# Patient Record
Sex: Female | Born: 1970 | Race: White | Hispanic: No | Marital: Married | Smoking: Current every day smoker
Health system: Southern US, Community
[De-identification: ages and names within clinical notes are randomized; demographics above are authoritative.]

## PROBLEM LIST (undated history)

## (undated) DIAGNOSIS — F329 Major depressive disorder, single episode, unspecified: Secondary | ICD-10-CM

## (undated) DIAGNOSIS — F32A Depression, unspecified: Secondary | ICD-10-CM

## (undated) DIAGNOSIS — B977 Papillomavirus as the cause of diseases classified elsewhere: Secondary | ICD-10-CM

## (undated) DIAGNOSIS — Z8659 Personal history of other mental and behavioral disorders: Secondary | ICD-10-CM

## (undated) DIAGNOSIS — F17200 Nicotine dependence, unspecified, uncomplicated: Secondary | ICD-10-CM

## (undated) DIAGNOSIS — B009 Herpesviral infection, unspecified: Secondary | ICD-10-CM

## (undated) DIAGNOSIS — F419 Anxiety disorder, unspecified: Secondary | ICD-10-CM

## (undated) HISTORY — DX: Anxiety disorder, unspecified: F41.9

## (undated) HISTORY — PX: CHOLECYSTECTOMY, LAPAROSCOPIC: SHX56

## (undated) HISTORY — PX: WISDOM TOOTH EXTRACTION: SHX21

## (undated) HISTORY — DX: Depression, unspecified: F32.A

## (undated) HISTORY — PX: TONSILLECTOMY: SHX5217

## (undated) HISTORY — DX: Nicotine dependence, unspecified, uncomplicated: F17.200

## (undated) HISTORY — DX: Personal history of other mental and behavioral disorders: Z86.59

## (undated) HISTORY — DX: Major depressive disorder, single episode, unspecified: F32.9

## (undated) HISTORY — DX: Papillomavirus as the cause of diseases classified elsewhere: B97.7

## (undated) HISTORY — PX: TEMPOROMANDIBULAR JOINT SURGERY: SHX35

## (undated) HISTORY — PX: GYNECOLOGIC CRYOSURGERY: SHX857

## (undated) HISTORY — DX: Herpesviral infection, unspecified: B00.9

---

## 1995-07-24 HISTORY — PX: ROTATOR CUFF REPAIR: SHX139

## 1999-03-04 ENCOUNTER — Ambulatory Visit (HOSPITAL_COMMUNITY): Admission: RE | Admit: 1999-03-04 | Discharge: 1999-03-04 | Payer: Self-pay | Admitting: Oral Surgery

## 1999-03-30 ENCOUNTER — Ambulatory Visit (HOSPITAL_COMMUNITY): Admission: RE | Admit: 1999-03-30 | Discharge: 1999-03-30 | Payer: Self-pay | Admitting: Oral Surgery

## 1999-10-13 ENCOUNTER — Other Ambulatory Visit: Admission: RE | Admit: 1999-10-13 | Discharge: 1999-10-13 | Payer: Self-pay | Admitting: Gynecology

## 1999-11-07 ENCOUNTER — Encounter (INDEPENDENT_AMBULATORY_CARE_PROVIDER_SITE_OTHER): Payer: Self-pay | Admitting: Specialist

## 1999-11-07 ENCOUNTER — Other Ambulatory Visit: Admission: RE | Admit: 1999-11-07 | Discharge: 1999-11-07 | Payer: Self-pay | Admitting: Gynecology

## 2000-05-16 ENCOUNTER — Other Ambulatory Visit: Admission: RE | Admit: 2000-05-16 | Discharge: 2000-05-16 | Payer: Self-pay | Admitting: Obstetrics and Gynecology

## 2001-03-25 ENCOUNTER — Ambulatory Visit (HOSPITAL_COMMUNITY): Admission: RE | Admit: 2001-03-25 | Discharge: 2001-03-25 | Payer: Self-pay | Admitting: *Deleted

## 2002-05-01 ENCOUNTER — Other Ambulatory Visit: Admission: RE | Admit: 2002-05-01 | Discharge: 2002-05-01 | Payer: Self-pay | Admitting: Gynecology

## 2003-07-13 ENCOUNTER — Other Ambulatory Visit: Admission: RE | Admit: 2003-07-13 | Discharge: 2003-07-13 | Payer: Self-pay | Admitting: Gynecology

## 2004-09-08 ENCOUNTER — Other Ambulatory Visit: Admission: RE | Admit: 2004-09-08 | Discharge: 2004-09-08 | Payer: Self-pay | Admitting: Gynecology

## 2006-03-06 ENCOUNTER — Other Ambulatory Visit: Admission: RE | Admit: 2006-03-06 | Discharge: 2006-03-06 | Payer: Self-pay | Admitting: Gynecology

## 2007-03-20 ENCOUNTER — Other Ambulatory Visit: Admission: RE | Admit: 2007-03-20 | Discharge: 2007-03-20 | Payer: Self-pay | Admitting: Gynecology

## 2007-03-24 DIAGNOSIS — B977 Papillomavirus as the cause of diseases classified elsewhere: Secondary | ICD-10-CM

## 2007-03-24 HISTORY — DX: Papillomavirus as the cause of diseases classified elsewhere: B97.7

## 2007-08-15 ENCOUNTER — Other Ambulatory Visit: Admission: RE | Admit: 2007-08-15 | Discharge: 2007-08-15 | Payer: Self-pay | Admitting: Gynecology

## 2008-05-13 ENCOUNTER — Other Ambulatory Visit: Admission: RE | Admit: 2008-05-13 | Discharge: 2008-05-13 | Payer: Self-pay | Admitting: Gynecology

## 2008-05-13 ENCOUNTER — Encounter: Payer: Self-pay | Admitting: Gynecology

## 2008-05-13 ENCOUNTER — Ambulatory Visit: Payer: Self-pay | Admitting: Gynecology

## 2008-05-26 ENCOUNTER — Ambulatory Visit: Payer: Self-pay | Admitting: Gynecology

## 2009-10-14 ENCOUNTER — Other Ambulatory Visit: Admission: RE | Admit: 2009-10-14 | Discharge: 2009-10-14 | Payer: Self-pay | Admitting: Gynecology

## 2009-10-14 ENCOUNTER — Ambulatory Visit: Payer: Self-pay | Admitting: Gynecology

## 2010-04-12 ENCOUNTER — Encounter: Admission: RE | Admit: 2010-04-12 | Discharge: 2010-04-12 | Payer: Self-pay | Admitting: Sports Medicine

## 2010-12-08 NOTE — Cardiovascular Report (Signed)
[place]. [facility]  Patient:    Debbie Estrada, Debbie Estrada Visit Number: [id number] MRN: [id number]          Service Type: END Location: ENDO Attending Physician:  Darlin Priestly Proc. Date: 03/25/01 Admit Date:  03/25/2001   CC:         Runell Gess, M.D.   Cardiac Catheterization  PROCEDURE:  Transesophageal echocardiogram.  COMPLICATIONS:  None.  INDICATIONS:  [name] is a 40 year old white female patient of Runell Gess, M.D. with a history of recurrent presyncope.  She had a two-dimensional echocardiogram revealing normal LV function with mild atrial septal bowing and signal dropout in the midatrial septum.  AST could not be ruled out.  She now referred for transesophageal echocardiogram to evaluate her atrial septum.  DESCRIPTION OF PROCEDURE:  The patient was brought to the endoscopy suite in a fasted state.  She was given 100 mg of Demerol and 4 mg of Versed.  After adequate anesthesia, the patient underwent successful uncomplicated transesophageal echocardiogram.  This revealed normal LV size with normal function.  Estimated EF of 60%.  There was mild septal hypokinesis.  There was mild thickening in the anterior mitral valve leaflet with trivial to mild mitral regurgitation.  The aortic valve appeared to be structurally normal. The tricuspid valve appears to be structurally normal with trivial tricuspid regurgitation.  There is normal pulmonic valve.  There is no evidence of intracardiac mass or thrombus noted.  The patient was noted to have mild bowing of the atrial septum into the right atrium.  There is a prominent fossa ovalis, but no evidence of ASD or patent foramen ovale.  There was a negative contrast bubble study.  There was a normal descending thoracic aorta.  CONCLUSION:  Successful and uncomplicated transesophageal echocardiogram with findings noted above. Attending Physician:  Darlin Priestly DD:  03/25/01 TD:   03/25/01 Job: [id number]

## 2011-09-28 ENCOUNTER — Encounter: Payer: Self-pay | Admitting: Gynecology

## 2011-09-28 ENCOUNTER — Ambulatory Visit (INDEPENDENT_AMBULATORY_CARE_PROVIDER_SITE_OTHER): Payer: BC Managed Care – PPO | Admitting: Gynecology

## 2011-09-28 VITALS — BP 128/86 | Ht 67.0 in | Wt 132.0 lb

## 2011-09-28 DIAGNOSIS — Z833 Family history of diabetes mellitus: Secondary | ICD-10-CM

## 2011-09-28 DIAGNOSIS — N920 Excessive and frequent menstruation with regular cycle: Secondary | ICD-10-CM | POA: Insufficient documentation

## 2011-09-28 DIAGNOSIS — Z01419 Encounter for gynecological examination (general) (routine) without abnormal findings: Secondary | ICD-10-CM

## 2011-09-28 DIAGNOSIS — A609 Anogenital herpesviral infection, unspecified: Secondary | ICD-10-CM | POA: Insufficient documentation

## 2011-09-28 DIAGNOSIS — N879 Dysplasia of cervix uteri, unspecified: Secondary | ICD-10-CM | POA: Insufficient documentation

## 2011-09-28 DIAGNOSIS — F17200 Nicotine dependence, unspecified, uncomplicated: Secondary | ICD-10-CM | POA: Insufficient documentation

## 2011-09-28 DIAGNOSIS — R635 Abnormal weight gain: Secondary | ICD-10-CM

## 2011-09-28 DIAGNOSIS — Z30432 Encounter for removal of intrauterine contraceptive device: Secondary | ICD-10-CM

## 2011-09-28 DIAGNOSIS — B009 Herpesviral infection, unspecified: Secondary | ICD-10-CM

## 2011-09-28 DIAGNOSIS — F411 Generalized anxiety disorder: Secondary | ICD-10-CM

## 2011-09-28 DIAGNOSIS — N946 Dysmenorrhea, unspecified: Secondary | ICD-10-CM | POA: Insufficient documentation

## 2011-09-28 DIAGNOSIS — D72829 Elevated white blood cell count, unspecified: Secondary | ICD-10-CM

## 2011-09-28 DIAGNOSIS — F419 Anxiety disorder, unspecified: Secondary | ICD-10-CM | POA: Insufficient documentation

## 2011-09-28 LAB — CBC WITH DIFFERENTIAL/PLATELET
Basophils Relative: 0 % (ref 0–1)
Eosinophils Absolute: 0 10*3/uL (ref 0.0–0.7)
Eosinophils Relative: 0 % (ref 0–5)
Lymphs Abs: 1.4 10*3/uL (ref 0.7–4.0)
MCH: 35.3 pg — ABNORMAL HIGH (ref 26.0–34.0)
MCHC: 33.7 g/dL (ref 30.0–36.0)
MCV: 104.7 fL — ABNORMAL HIGH (ref 78.0–100.0)
Neutrophils Relative %: 78 % — ABNORMAL HIGH (ref 43–77)
Platelets: 199 10*3/uL (ref 150–400)
RDW: 13.4 % (ref 11.5–15.5)

## 2011-09-28 LAB — LIPID PANEL
Cholesterol: 184 mg/dL (ref 0–200)
Total CHOL/HDL Ratio: 3.1 Ratio

## 2011-09-28 LAB — TSH: TSH: 1.45 u[IU]/mL (ref 0.350–4.500)

## 2011-09-28 LAB — GLUCOSE, RANDOM: Glucose, Bld: 95 mg/dL (ref 70–99)

## 2011-09-28 MED ORDER — VALACYCLOVIR HCL 500 MG PO TABS: ORAL_TABLET | ORAL | Status: DC

## 2011-09-28 MED ORDER — ALPRAZOLAM 0.5 MG PO TABS: 0.5000 mg | ORAL_TABLET | Freq: Every evening | ORAL | Status: DC | PRN

## 2011-09-28 NOTE — Progress Notes (Signed)
Debbie Estrada November 15, 1970 [id number]   History:    41 y.o.  for annual exam who Corrie Dandy a year ago. Patient has Mirena IUD that was placed in November of 2009 and wanted to have it removed since her partner has had a vasectomy. 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 She also suffers from anxiety for which she takes in a 0.5 mg daily as needed. Patient smokes half a pack cigarette per day. Mother diagnosed with breast cancer at the age of 30. Patient does her monthly self breast examination. 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 Patient last seen in the office in 2011. Patient complaining of weight gain.  Past medical history,surgical history, family history and social history were all reviewed and documented in the EPIC chart.  Gynecologic History Patient's last menstrual period was 09/12/2011. Contraception: vasectomy Last Pap: 2011. Results were: normal Last mammogram: No prior study. Results were: No prior study  Obstetric History OB History    Grav Para Term Preterm Abortions TAB SAB Ect Mult Living   0                ROS:  Was performed and pertinent positives and negatives are included in the history.  Exam: chaperone present  BP 128/86  Ht 5\' 7"  (1.702 m)  Wt 132 lb (59.875 kg)  BMI 20.67 kg/m2  LMP 09/12/2011  Body mass index is 20.67 kg/(m^2).  General appearance : Well developed well nourished female. No acute distress HEENT: Neck supple, trachea midline, no carotid bruits, no thyroidmegaly Lungs: Clear to auscultation, no rhonchi or wheezes, or rib retractions  Heart: Regular rate and rhythm, no murmurs or gallops Breast:Examined in sitting and supine position were symmetrical in appearance, no palpable masses or tenderness,  no skin retraction, no nipple inversion, no nipple discharge, no skin discoloration, no axillary or supraclavicular  lymphadenopathy Abdomen: no palpable masses or tenderness, no rebound or guarding Extremities: no edema or skin discoloration or tenderness  Pelvic:  Bartholin, Urethra, Skene Glands: Within normal limits             Vagina: No gross lesions or discharge  Cervix: No gross lesions or discharge  Uterus  anteverted, normal size, shape and consistency, non-tender and mobile  Adnexa  Without masses or tenderness  Anus and perineum  normal   Rectovaginal  normal sphincter tone without palpated masses or tenderness             Hemoccult not done   Cervix was cleansed with Betadine solution the IUD string was grasped with a [place] clamp and the IUD was removed shown to the patient and discarded.  Assessment/Plan:  41 y.o. female for annual exam . We discussed the detrimental effects of smoking. Patient will [facility] She was instructed take calcium and vitamin D for osteoporosis prevention. Prescription for Valtrex 500 mg to take daily for HSV prophylaxis was provided. Prescription for Xanax 0.5 mg to take 1 by mouth daily when necessary anxiety. Requisition provided for patient to get a baseline mammogram since her mother was diagnosed breast cancer at age 43. A prescription for Lysteda 650 mg to take 2 tablets 3 times a day for 5 days during her menstrual cycle. The following labs will be drawn today: CBC, fasting lipid profile, TSH, fasting blood sugar, and urinalysis. New Pap smear screening guidelines discussed. No Pap smear  done today. Next Pap smear due next year.    Ok Edwards MD, 9:08 AM 09/28/2011

## 2011-09-28 NOTE — Patient Instructions (Addendum)

## 2011-09-29 LAB — URINALYSIS W MICROSCOPIC + REFLEX CULTURE
Bacteria, UA: NONE SEEN
Hgb urine dipstick: NEGATIVE
Ketones, ur: NEGATIVE mg/dL
Leukocytes, UA: NEGATIVE
Nitrite: NEGATIVE
Specific Gravity, Urine: 1.009 (ref 1.005–1.030)
Urobilinogen, UA: 0.2 mg/dL (ref 0.0–1.0)
pH: 7 (ref 5.0–8.0)

## 2011-10-01 ENCOUNTER — Telehealth: Payer: Self-pay | Admitting: *Deleted

## 2011-10-01 MED ORDER — TRANEXAMIC ACID 650 MG PO TABS: 1300.0000 mg | ORAL_TABLET | Freq: Three times a day (TID) | ORAL | Status: DC

## 2011-10-01 NOTE — Telephone Encounter (Signed)
Pt called stating pharmacy never got rx for Lysteda 650 mg to take 2 tablets 3 times a day for 5 days during her menstrual cycle. rx will be sent to pharmacy.

## 2011-10-02 NOTE — Progress Notes (Signed)
Addended by: Venora Maples on: 10/02/2011 12:25 PM   Modules accepted: Orders

## 2012-09-30 ENCOUNTER — Other Ambulatory Visit: Payer: Self-pay | Admitting: Gynecology

## 2013-05-05 ENCOUNTER — Telehealth: Payer: Self-pay | Admitting: *Deleted

## 2013-05-05 ENCOUNTER — Encounter: Payer: Self-pay | Admitting: Women's Health

## 2013-05-05 ENCOUNTER — Ambulatory Visit (INDEPENDENT_AMBULATORY_CARE_PROVIDER_SITE_OTHER): Payer: BC Managed Care – PPO | Admitting: Women's Health

## 2013-05-05 ENCOUNTER — Other Ambulatory Visit (HOSPITAL_COMMUNITY)
Admission: RE | Admit: 2013-05-05 | Discharge: 2013-05-05 | Disposition: A | Discharge status: Home / Self Care | Payer: BC Managed Care – PPO | Source: Ambulatory Visit | Attending: Gynecology | Admitting: Gynecology

## 2013-05-05 VITALS — BP 110/68 | Ht 65.5 in | Wt 110.0 lb

## 2013-05-05 DIAGNOSIS — G518 Other disorders of facial nerve: Secondary | ICD-10-CM

## 2013-05-05 DIAGNOSIS — G500 Trigeminal neuralgia: Secondary | ICD-10-CM

## 2013-05-05 DIAGNOSIS — B009 Herpesviral infection, unspecified: Secondary | ICD-10-CM

## 2013-05-05 DIAGNOSIS — N63 Unspecified lump in unspecified breast: Secondary | ICD-10-CM

## 2013-05-05 DIAGNOSIS — Z803 Family history of malignant neoplasm of breast: Secondary | ICD-10-CM

## 2013-05-05 DIAGNOSIS — Z01419 Encounter for gynecological examination (general) (routine) without abnormal findings: Secondary | ICD-10-CM | POA: Insufficient documentation

## 2013-05-05 DIAGNOSIS — F411 Generalized anxiety disorder: Secondary | ICD-10-CM

## 2013-05-05 DIAGNOSIS — F419 Anxiety disorder, unspecified: Secondary | ICD-10-CM

## 2013-05-05 MED ORDER — VALACYCLOVIR HCL 500 MG PO TABS: ORAL_TABLET | ORAL | Status: DC

## 2013-05-05 MED ORDER — ALPRAZOLAM 0.5 MG PO TABS: 0.5000 mg | ORAL_TABLET | Freq: Every evening | ORAL | Status: DC | PRN

## 2013-05-05 NOTE — Telephone Encounter (Signed)
Order placed for the breast center

## 2013-05-05 NOTE — Telephone Encounter (Signed)
Message copied by Aura Camps on Tue May 05, 2013  2:30 PM ------      Message from: [place], [place] J      Created: Tue May 05, 2013  1:03 PM       Please schedule pt diagnostic mammogram. 1 cm mobile nontender nodule left breast upper outer quadrant. Noted on annual exam.  Mother breast cancer history in her 52s doing well. Palpation of work/disability for facial pain anytime okay. ------

## 2013-05-05 NOTE — Patient Instructions (Signed)

## 2013-05-05 NOTE — Progress Notes (Signed)
Debbie Estrada 04/15/1971 [id number]    History:    The patient presents for annual exam.  Monthly cycles/heavy flow first 3 days/vasectomy. CIN-1 cryo- 2001 normal Paps after. HSV  rare outbreaks. Smokes half pack per day. Having severe facial pain has been to several neurologists with unknown cause. Currently on disability due to pain. Had lost 35 pounds and at one point was down to 95 pounds currently at 110, 132 last year at annual exam. History of anorexia and bulimia denies any of those issues at this time. Effexor prescribed through pain clinic and rare Xanax use, uses less than one prescription per year. Mother breast cancer in her 71s who is doing well.  Past medical history, past surgical history, family history and social history were all reviewed and documented in the EPIC chart. Father hypertension.   ROS:  A  ROS was performed and pertinent positives and negatives are included in the history.  Exam:  Filed Vitals:   05/05/13 1031  BP: 110/68    General appearance: thin Head/Neck:  Normal, without cervical or supraclavicular adenopathy. Thyroid:  Symmetrical, normal in size, without palpable masses or nodularity. Respiratory  Effort:  Normal  Auscultation:  Clear without wheezing or rhonchi Cardiovascular  Auscultation:  Regular rate, without rubs, murmurs or gallops  Edema/varicosities:  Not grossly evident Abdominal  Soft,nontender, without masses, guarding or rebound.  Liver/spleen:  No organomegaly noted  Hernia:  None appreciated  Skin  Inspection:  Grossly normal  Palpation:  Grossly normal Neurologic/psychiatric  Orientation:  Normal with appropriate conversation.  Mood/affect:  Normal  Genitourinary    Breasts: Examined lying and sitting.     Right: Without masses, retractions, discharge or axillary adenopathy.     Left: Without masses, retractions, discharge or axillary adenopathy.   Inguinal/mons:  Normal without inguinal adenopathy  External  genitalia:  Normal  BUS/Urethra/Skene's glands:  Normal  Bladder:  Normal  Vagina:  Normal  Cervix:  Normal  Uterus:   normal in size, shape and contour.  Midline and mobile  Adnexa/parametria:     Rt: Without masses or tenderness.   Lt: Without masses or tenderness.  Anus and perineum: Normal  Digital rectal exam: Normal sphincter tone without palpated masses or tenderness  Assessment/Plan:  42 y.o. MWF G0  for annual exam.    Severe facial pain Menorrhagia/vasectomy Smoker 2001 CIN-1 cryo- normal after Anxiety/depression/pain- Effexor HSV  Plan: Continue followup with neurologist. SBE's, annual mammogram, will schedule diagnostic. 1 cm mobile nontender nodule left upper outer quadrant. Aware of hazards of smoking, reviewed patches and other means to quit smoking. Xanax 0.5 at bedtime when necessary aware of addictive properties and use sparingly. Valtrex 500 twice daily 3-5 days as needed. UA, Pap. Options for menorrhagia reviewed, declines at this time, her option information given and reviewed, will call if decides to pursue. Discussed importance of increasing calories for weight gain and health. Denies need for counseling at this time.  Harrington Challenger [facility], 12:49 PM 05/05/2013

## 2013-05-07 ENCOUNTER — Other Ambulatory Visit: Payer: Self-pay | Admitting: Women's Health

## 2013-05-07 DIAGNOSIS — N63 Unspecified lump in unspecified breast: Secondary | ICD-10-CM

## 2013-05-07 DIAGNOSIS — Z803 Family history of malignant neoplasm of breast: Secondary | ICD-10-CM

## 2013-05-08 NOTE — Telephone Encounter (Signed)
appt 05/18/13 2 2:30 pm

## 2013-05-18 ENCOUNTER — Inpatient Hospital Stay: Admission: RE | Admit: 2013-05-18 | Payer: BC Managed Care – PPO | Source: Ambulatory Visit

## 2013-05-18 ENCOUNTER — Other Ambulatory Visit: Payer: BC Managed Care – PPO

## 2013-05-21 ENCOUNTER — Ambulatory Visit
Admission: RE | Admit: 2013-05-21 | Discharge: 2013-05-21 | Disposition: A | Discharge status: Home / Self Care | Payer: BC Managed Care – PPO | Source: Ambulatory Visit | Attending: Women's Health | Admitting: Women's Health

## 2013-05-21 ENCOUNTER — Other Ambulatory Visit: Payer: Self-pay | Admitting: Women's Health

## 2013-05-21 DIAGNOSIS — Z803 Family history of malignant neoplasm of breast: Secondary | ICD-10-CM

## 2013-05-21 DIAGNOSIS — N63 Unspecified lump in unspecified breast: Secondary | ICD-10-CM

## 2013-09-25 ENCOUNTER — Other Ambulatory Visit: Payer: Self-pay | Admitting: Women's Health

## 2013-09-25 NOTE — Telephone Encounter (Signed)
Rx called in [facility] 

## 2013-12-10 ENCOUNTER — Other Ambulatory Visit: Payer: Self-pay | Admitting: Women's Health

## 2013-12-10 NOTE — Telephone Encounter (Signed)
Called into pharmacy

## 2014-02-07 ENCOUNTER — Other Ambulatory Visit: Payer: Self-pay | Admitting: Women's Health

## 2014-02-08 NOTE — Telephone Encounter (Signed)
Ok to refill 

## 2014-02-08 NOTE — Telephone Encounter (Signed)
Called into pharmacy

## 2014-03-14 ENCOUNTER — Other Ambulatory Visit: Payer: Self-pay | Admitting: Women's Health

## 2014-03-15 NOTE — Telephone Encounter (Signed)
rx called in

## 2014-04-21 IMAGING — MG MM DIGITAL DIAGNOSTIC BILAT
7 series · 7 of 7 positions shown · non-contrast
Comparison: This is the patient's baseline exam.

CLINICAL DATA: Questioned palpable finding per patient left breast
1 o'clock location

EXAM:
DIGITAL DIAGNOSTIC BILATERAL MAMMOGRAM WITH CAD AND BILATERAL BREAST
ULTRASOUND

[R MLO (1 of 2)]
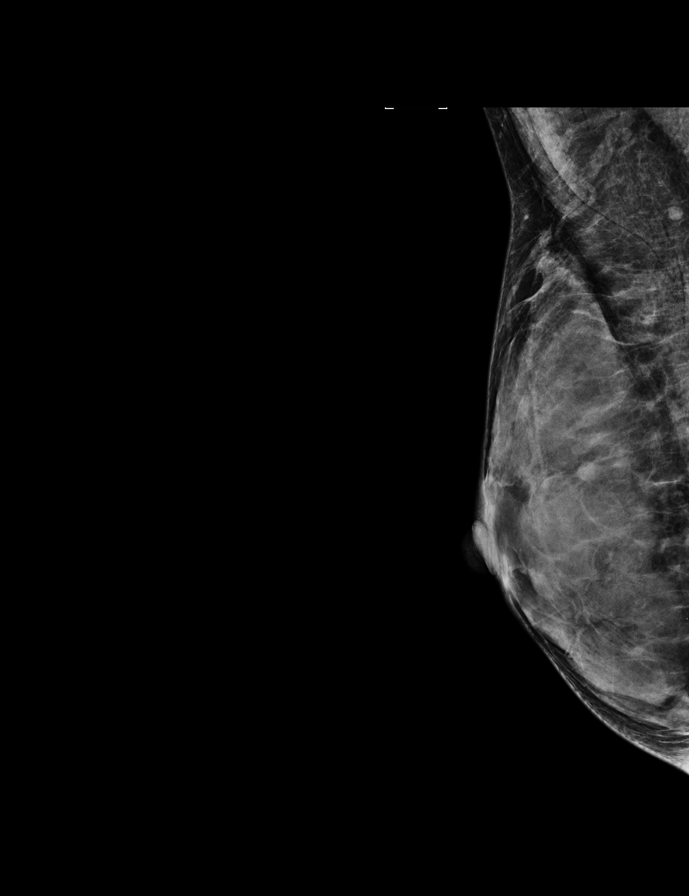

[L CC]
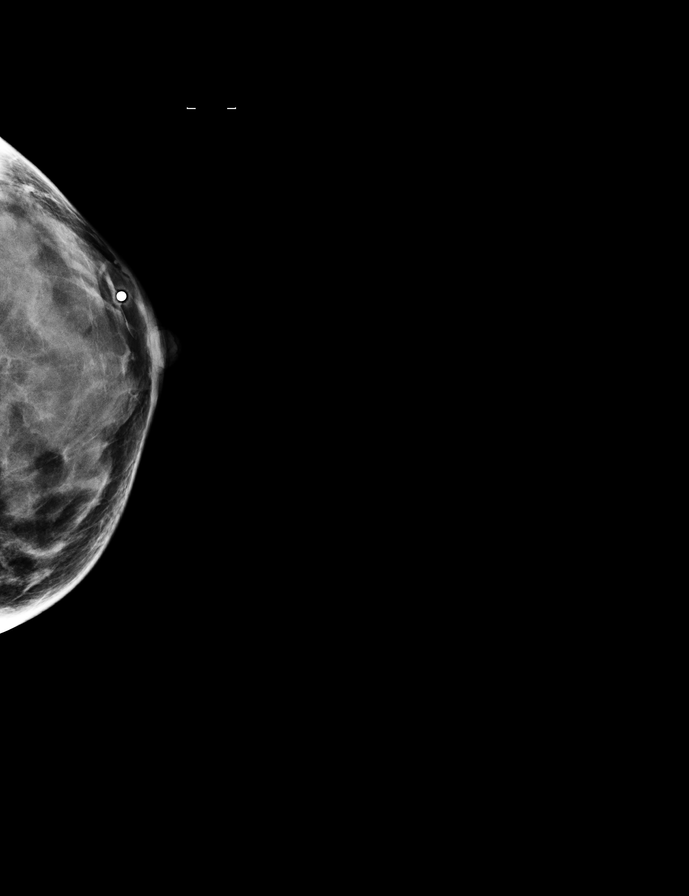

[R CC (1 of 2)]
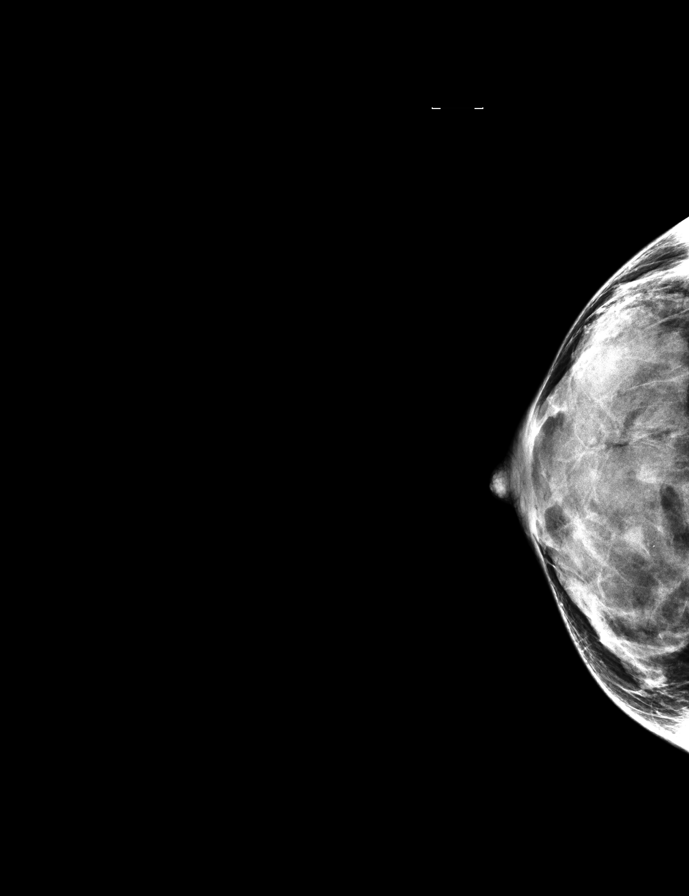

[R CC (2 of 2)]
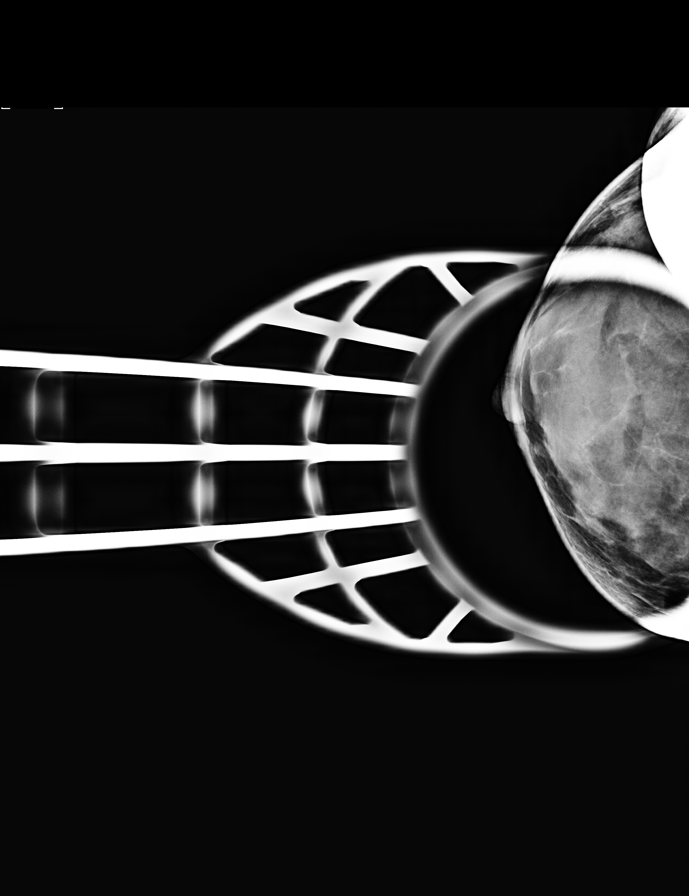

[L TAN]
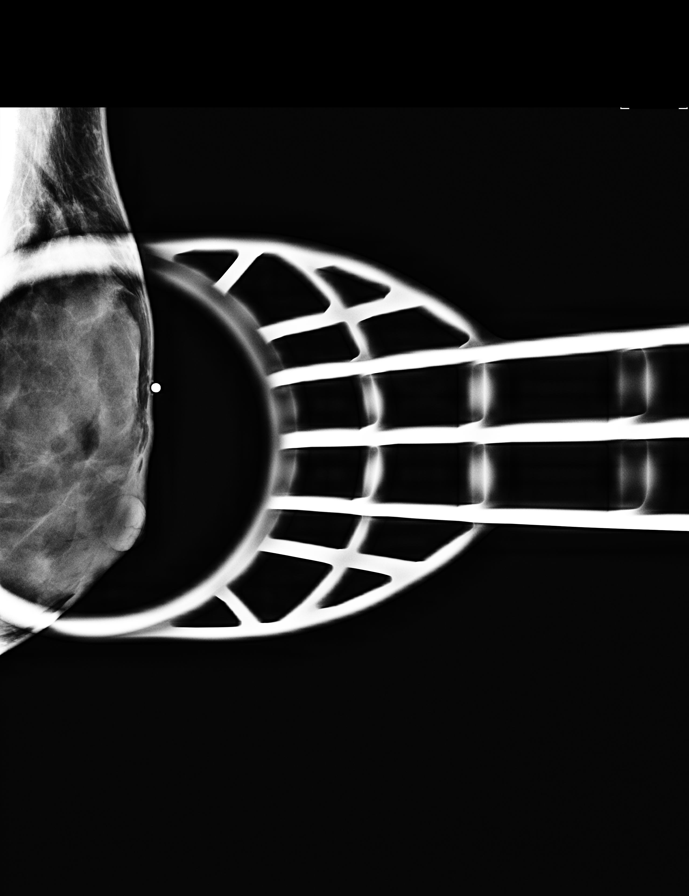

[L MLO]
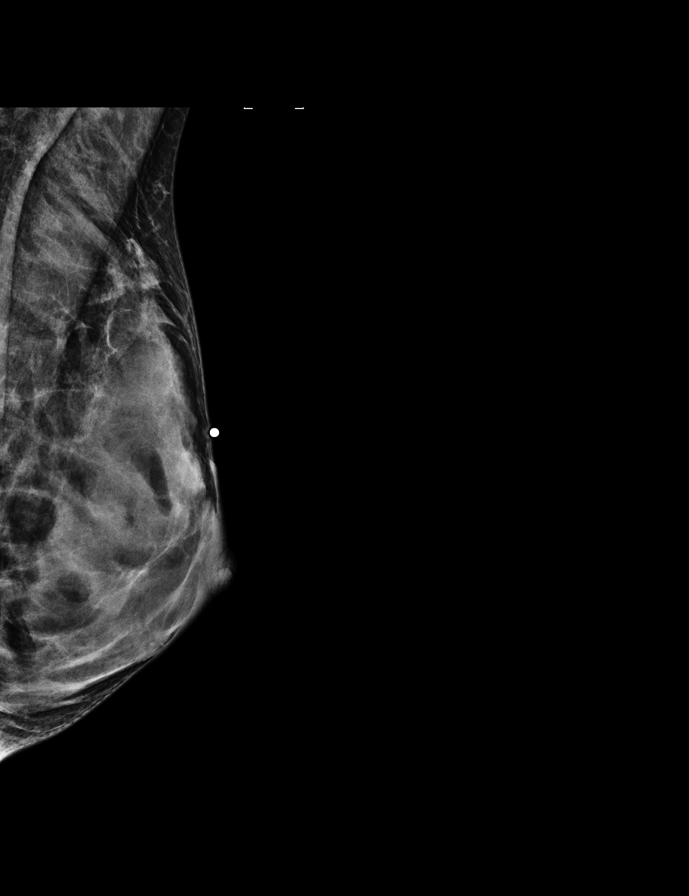

[R MLO (2 of 2)]
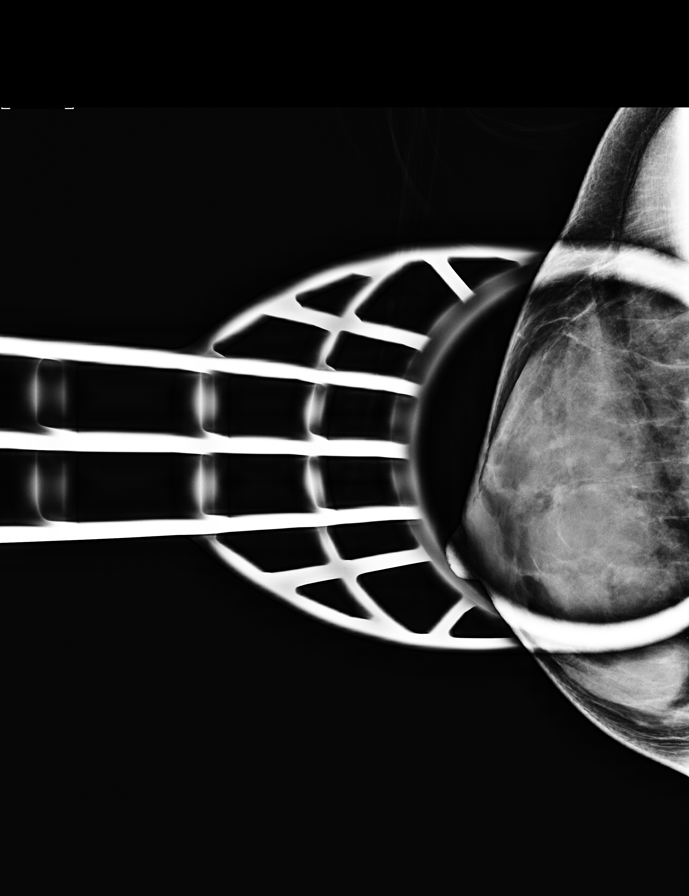

[7 of 7 positions shown; findings below may reference images not displayed]

ACR Breast Density Category d: The breasts are extremely dense,
which lowers the sensitivity of mammography.
FINDINGS: There is a possible mass in the right breast approximate 12 o'clock
location. No suspicious finding is seen in the left breast.

Mammographic images were processed with CAD.

At physical exam, I palpate an oval mobile mass left breast 1
o'clock location at the site of the patient's questioned palpable
finding.

Title ultrasound, there is a simple appearing cyst in the left
breast 1 o'clock location 3 cm from the nipple measuring 10 x 9 x 9
mm. This corresponds to the questioned palpable finding. Other
simple appearing smaller cysts are also noted in the left upper
outer quadrant elsewhere.

In the right breast 12 o'clock location 3 cm from the nipple, there
is a simple appearing anechoic cyst measuring 7 x 7 x 5 mm. This
corresponds to the mammographic finding.
IMPRESSION: No evidence for malignancy. Bilateral simple appearing cysts.

RECOMMENDATION:
Screening mammogram in one year.(Code:UQ-S-44J)

I have discussed the findings and recommendations with the patient.
Results were also provided in writing at the conclusion of the
visit. If applicable, a reminder letter will be sent to the patient
regarding the next appointment.

BI-RADS CATEGORY  2: Benign Finding(s)

## 2014-04-21 IMAGING — US US BREAST BILAT
1 series · 9 of 9 positions shown · non-contrast
Comparison: This is the patient's baseline exam.

CLINICAL DATA: Questioned palpable finding per patient left breast
1 o'clock location

EXAM:
DIGITAL DIAGNOSTIC BILATERAL MAMMOGRAM WITH CAD AND BILATERAL BREAST
ULTRASOUND

[Series 1: us breast bilat · 9 of 9 slices shown]
[im 1/9]
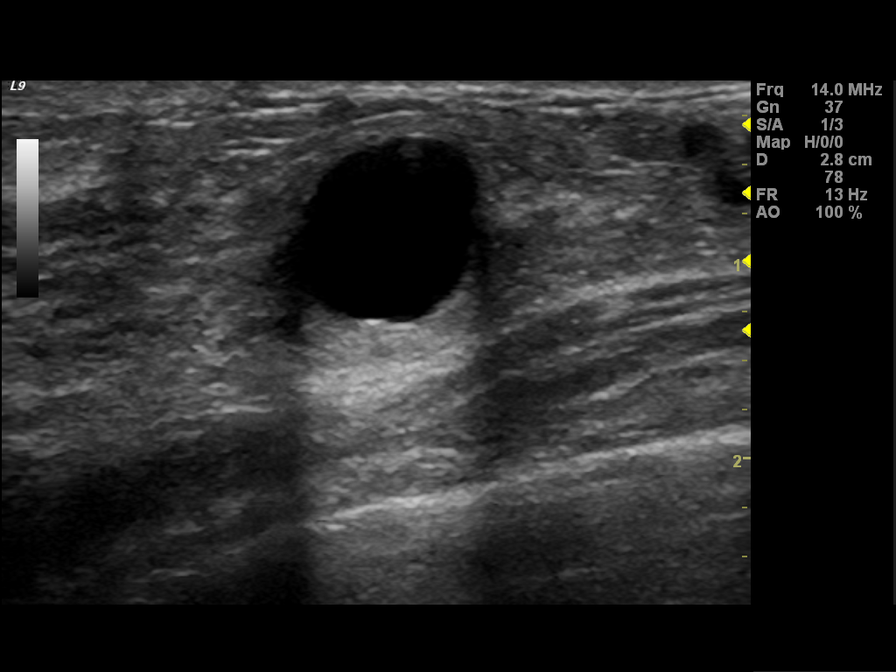
[im 2/9]
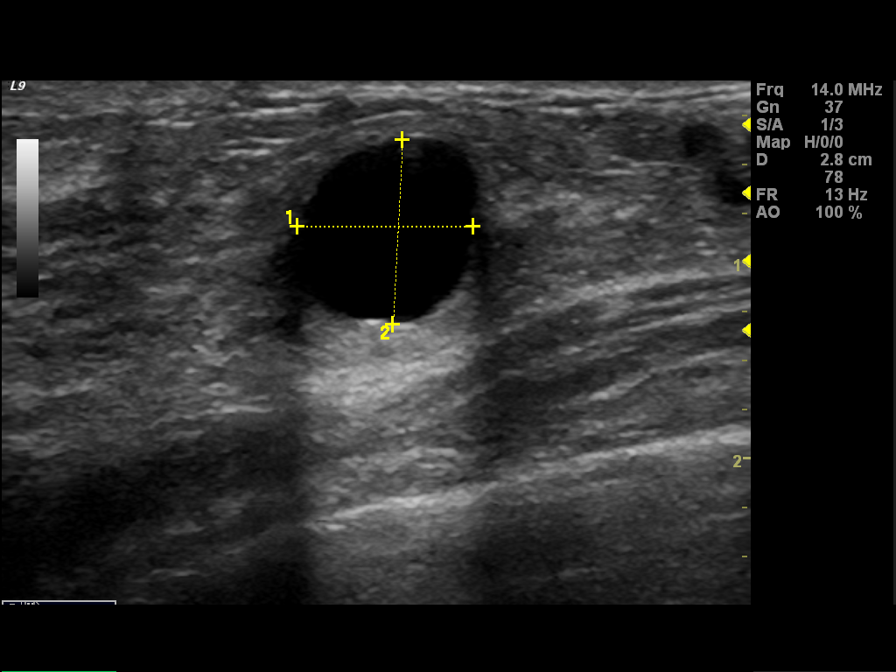
[im 3/9]
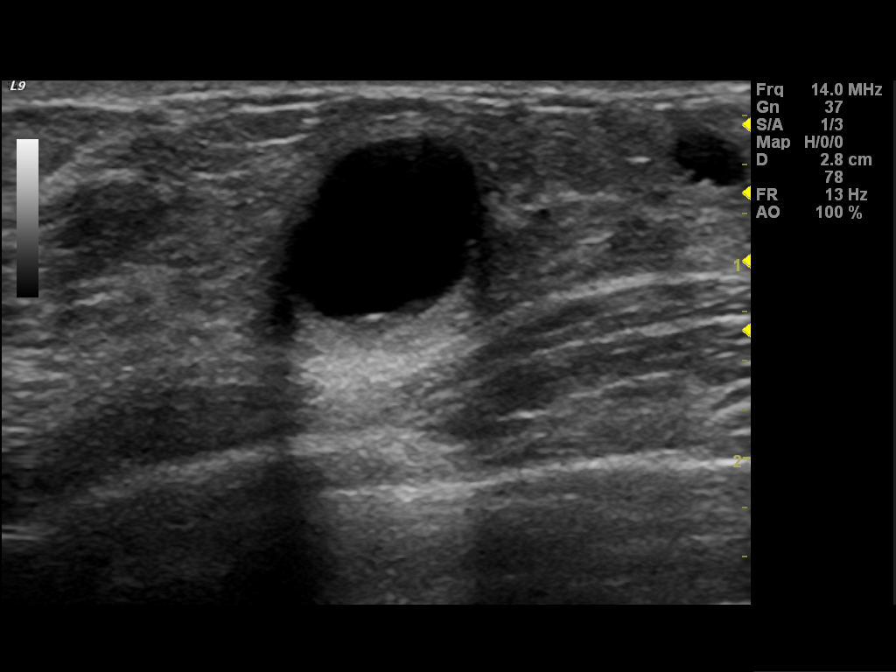
[im 4/9]
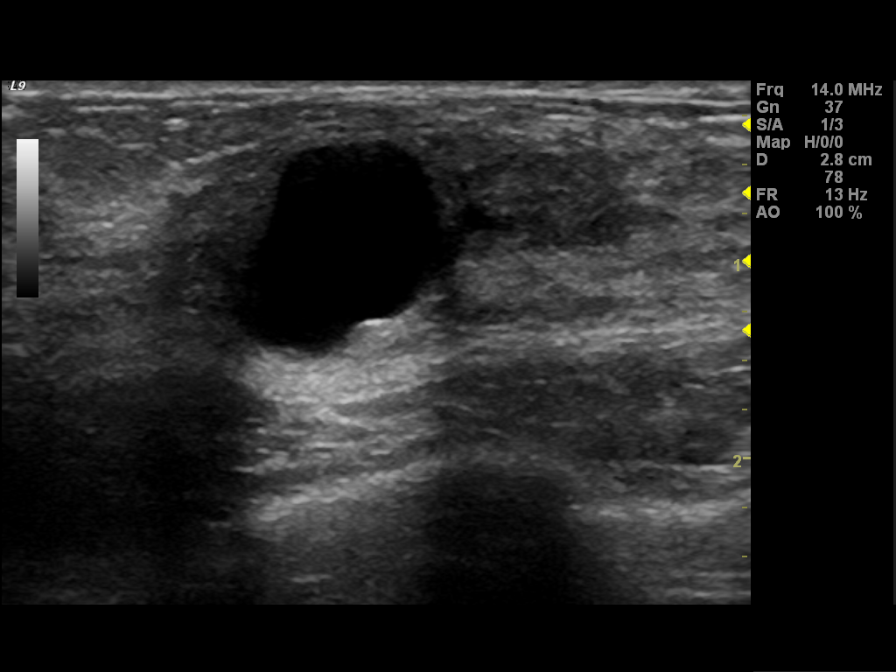
[im 5/9]
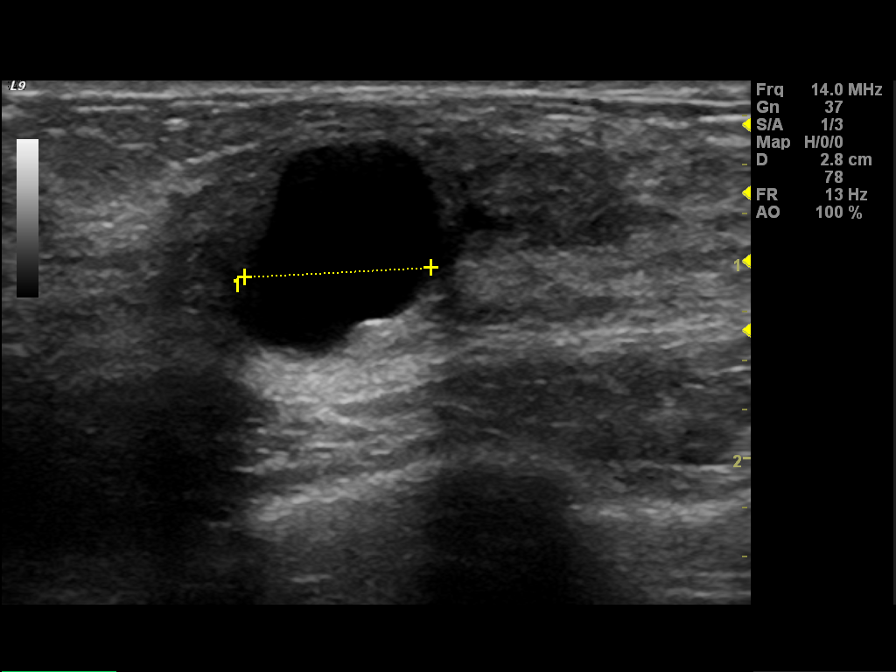
[im 6/9]
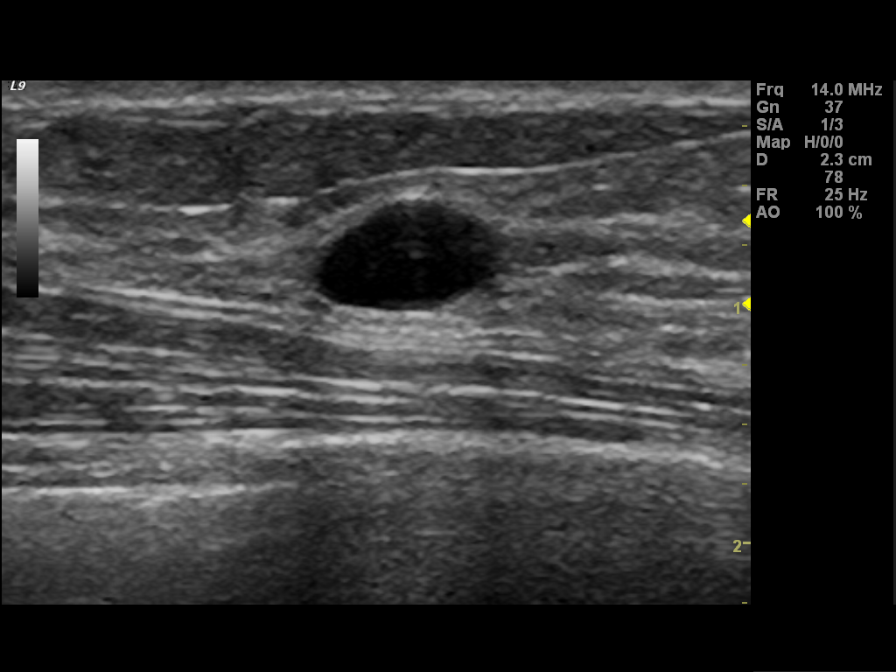
[im 7/9]
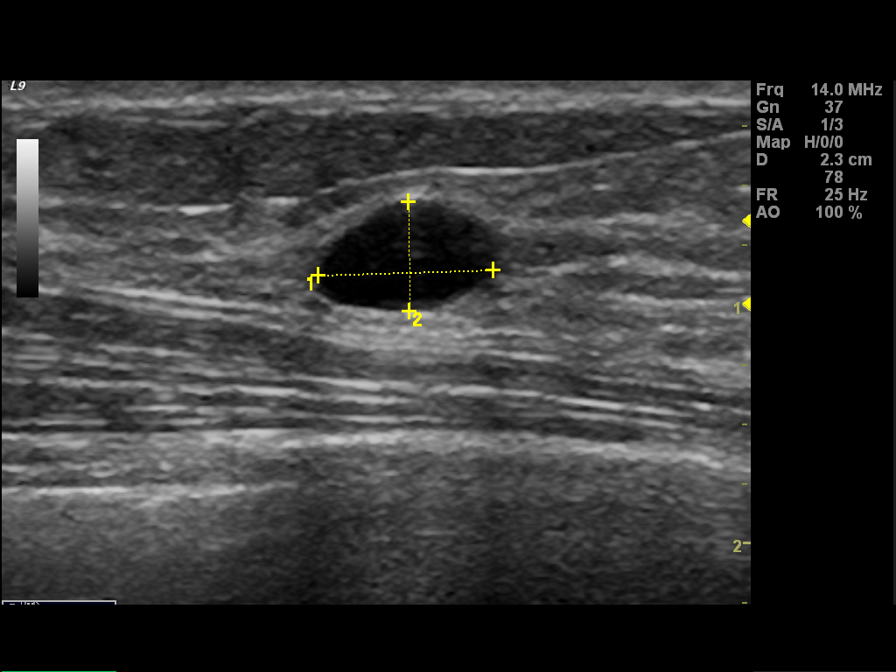
[im 8/9]
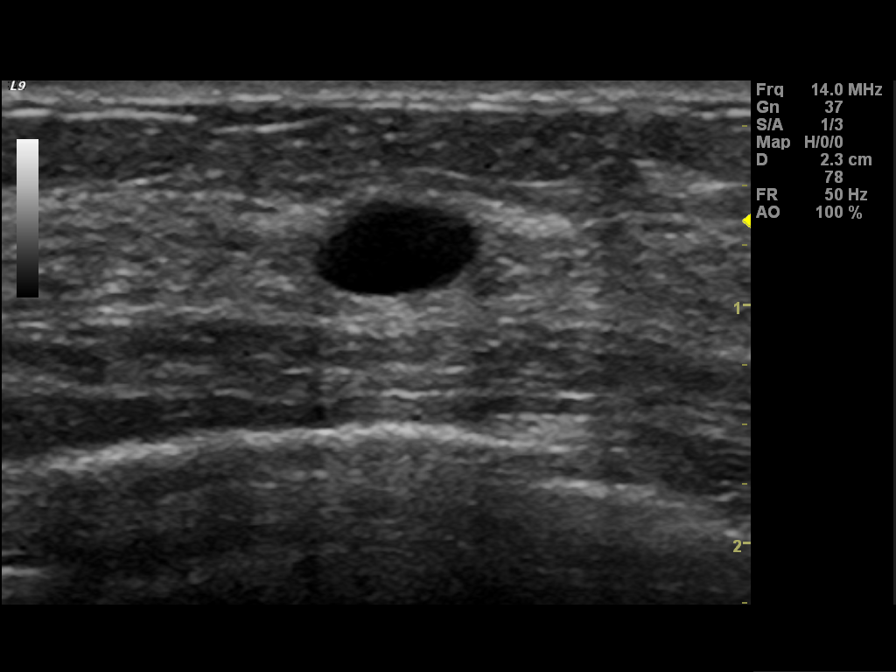
[im 9/9]
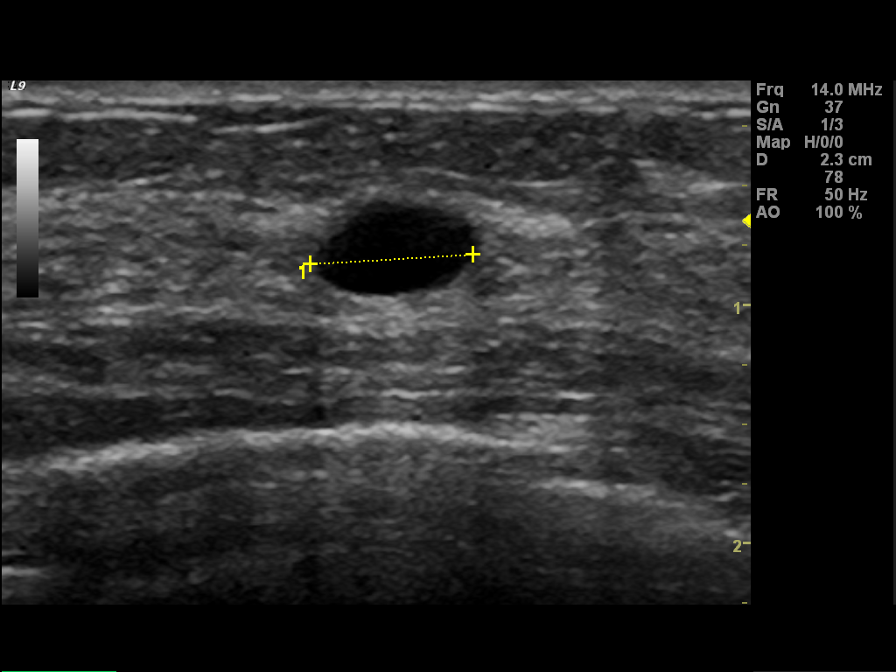

[9 of 9 positions shown; findings below may reference images not displayed]

ACR Breast Density Category d: The breasts are extremely dense,
which lowers the sensitivity of mammography.
FINDINGS: There is a possible mass in the right breast approximate 12 o'clock
location. No suspicious finding is seen in the left breast.

Mammographic images were processed with CAD.

At physical exam, I palpate an oval mobile mass left breast 1
o'clock location at the site of the patient's questioned palpable
finding.

Title ultrasound, there is a simple appearing cyst in the left
breast 1 o'clock location 3 cm from the nipple measuring 10 x 9 x 9
mm. This corresponds to the questioned palpable finding. Other
simple appearing smaller cysts are also noted in the left upper
outer quadrant elsewhere.

In the right breast 12 o'clock location 3 cm from the nipple, there
is a simple appearing anechoic cyst measuring 7 x 7 x 5 mm. This
corresponds to the mammographic finding.
IMPRESSION: No evidence for malignancy. Bilateral simple appearing cysts.

RECOMMENDATION:
Screening mammogram in one year.(Code:UQ-S-44J)

I have discussed the findings and recommendations with the patient.
Results were also provided in writing at the conclusion of the
visit. If applicable, a reminder letter will be sent to the patient
regarding the next appointment.

BI-RADS CATEGORY  2: Benign Finding(s)

## 2014-08-16 ENCOUNTER — Other Ambulatory Visit: Payer: Self-pay

## 2014-08-17 ENCOUNTER — Other Ambulatory Visit: Payer: Self-pay

## 2014-09-07 ENCOUNTER — Other Ambulatory Visit: Payer: Self-pay

## 2014-09-07 MED ORDER — ALPRAZOLAM 0.5 MG PO TABS: ORAL_TABLET | ORAL | Status: AC

## 2014-09-07 NOTE — Telephone Encounter (Signed)
Overdue for yearly exam since Oct but has CE [place]/14 [place].

## 2014-09-07 NOTE — Telephone Encounter (Signed)
Called into pharmacy

## 2014-09-14 ENCOUNTER — Other Ambulatory Visit (HOSPITAL_COMMUNITY)
Admission: RE | Admit: 2014-09-14 | Discharge: 2014-09-14 | Disposition: A | Discharge status: Home / Self Care | Payer: BLUE CROSS/BLUE SHIELD | Source: Ambulatory Visit | Attending: Women's Health | Admitting: Women's Health

## 2014-09-14 ENCOUNTER — Encounter: Payer: Self-pay | Admitting: Women's Health

## 2014-09-14 ENCOUNTER — Ambulatory Visit (INDEPENDENT_AMBULATORY_CARE_PROVIDER_SITE_OTHER): Payer: BLUE CROSS/BLUE SHIELD | Admitting: Women's Health

## 2014-09-14 VITALS — BP 122/80 | Ht 65.0 in

## 2014-09-14 DIAGNOSIS — N898 Other specified noninflammatory disorders of vagina: Secondary | ICD-10-CM

## 2014-09-14 DIAGNOSIS — B373 Candidiasis of vulva and vagina: Secondary | ICD-10-CM

## 2014-09-14 DIAGNOSIS — Z1151 Encounter for screening for human papillomavirus (HPV): Secondary | ICD-10-CM | POA: Insufficient documentation

## 2014-09-14 DIAGNOSIS — Z01411 Encounter for gynecological examination (general) (routine) with abnormal findings: Secondary | ICD-10-CM | POA: Diagnosis present

## 2014-09-14 DIAGNOSIS — Z01419 Encounter for gynecological examination (general) (routine) without abnormal findings: Secondary | ICD-10-CM

## 2014-09-14 DIAGNOSIS — B009 Herpesviral infection, unspecified: Secondary | ICD-10-CM

## 2014-09-14 DIAGNOSIS — B3731 Acute candidiasis of vulva and vagina: Secondary | ICD-10-CM

## 2014-09-14 DIAGNOSIS — G4700 Insomnia, unspecified: Secondary | ICD-10-CM

## 2014-09-14 LAB — WET PREP FOR TRICH, YEAST, CLUE
CLUE CELLS WET PREP: NONE SEEN
Trich, Wet Prep: NONE SEEN

## 2014-09-14 MED ORDER — FLUCONAZOLE 150 MG PO TABS: 150.0000 mg | ORAL_TABLET | Freq: Once | ORAL | Status: AC

## 2014-09-14 MED ORDER — VALACYCLOVIR HCL 500 MG PO TABS: ORAL_TABLET | ORAL | Status: AC

## 2014-09-14 MED ORDER — TRAZODONE HCL 50 MG PO TABS: 50.0000 mg | ORAL_TABLET | ORAL | Status: AC | PRN

## 2014-09-14 NOTE — Addendum Note (Signed)
Addended by: Kem ParkinsonBARNES, Raenell Mensing on: 09/14/2014 03:35 PM   Modules accepted: Orders

## 2014-09-14 NOTE — Patient Instructions (Signed)

## 2014-09-14 NOTE — Progress Notes (Signed)
Frederik SchmidtKimberly E Chavana 05/05/1971 [id number]    History:    Presents for annual exam.  Monthly cycles/vasectomy. 04/2013 mammogram normal after ultrasound. 2001 CIN-1, cryo- with normal  Paps after. HSV rare outbreaks. Smokes half pack per day. Mother breast cancer in her 6060s 5 year survivor. Having severe facial pain on disability for trigeminal nerve pain, [facility], Botox injections and medications.  Past medical history, past surgical history, family history and social history were all reviewed and documented in the EPIC chart. Reports poor quality of life from chronic pain. Pain/dryness with intercourse. 3 stepchildren every other week.  ROS:  A ROS was performed and pertinent positives and negatives are included.  Exam:  Filed Vitals:   09/14/14 1357  BP: 122/80    General appearance:  Appears in poor health, thin,  Thyroid:  Symmetrical, normal in size, without palpable masses or nodularity. Respiratory  Auscultation:  Clear without wheezing or rhonchi Cardiovascular  Auscultation:  Regular rate, without rubs, murmurs or gallops  Edema/varicosities:  Not grossly evident Abdominal  Soft,nontender, without masses, guarding or rebound.  Liver/spleen:  No organomegaly noted  Hernia:  None appreciated  Skin  Inspection:  Grossly normal   Breasts: Examined lying and sitting.     Right: Without masses, retractions, discharge or axillary adenopathy.     Left: Without masses, retractions, discharge or axillary adenopathy. Gentitourinary   Inguinal/mons:  Normal without inguinal adenopathy  External genitalia:  Normal  BUS/Urethra/Skene's glands:  Normal  Vagina:  White discharge, wet prep positive for yeast  Cervix:  Normal  Uterus:   normal in size, shape and contour.  Midline and mobile  Adnexa/parametria:     Rt: Without masses or tenderness.   Lt: Without masses or tenderness.  Anus and perineum: Normal  Digital rectal exam: Refused  Assessment/Plan:  44  y.o. MWF G0 for annual exam.     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 Vaginal lubricants reviewed, will try Replens twice weekly and over-the-counter lubricant with intercourse. SBE's, annual mammogram overdue, instructed to schedule 3-D, history of dense breasts. Reports unable to do much exercise, increase as able. Valtrex twice daily for 3-5 days as needed, prescription, proper use given and reviewed. Trazodone 50 at bedtime #15 no refills, has mail order sent that was told it may be 2 weeks prior to receiving. Reviewed we will not be able to continue to refill. Pap with HR HPV typing, normal Pap 2014, new screening guidelines reviewed. Aware of hazards of smoking is trying to quit.    Harrington ChallengerYOUNG,Cathryn Gallery J [facility] Surgery[facility]NP, 2:55 PM 09/14/2014

## 2014-09-16 LAB — CYTOLOGY - PAP

## 2016-12-05 ENCOUNTER — Encounter: Payer: Self-pay | Admitting: Gynecology

## 2018-07-02 ENCOUNTER — Ambulatory Visit: Payer: BLUE CROSS/BLUE SHIELD | Admitting: Women's Health

## 2018-07-02 DIAGNOSIS — Z0289 Encounter for other administrative examinations: Secondary | ICD-10-CM

## 2018-08-19 ENCOUNTER — Ambulatory Visit: Payer: Self-pay | Admitting: Women's Health
